# Patient Record
Sex: Female | Born: 1995 | Race: White | Hispanic: No | Marital: Single | Smoking: Heavy tobacco smoker
Health system: Southern US, Community
[De-identification: ages and names within clinical notes are randomized; demographics above are authoritative.]

---

## 2014-01-08 HISTORY — PX: CHOLECYSTECTOMY: SHX55

## 2014-08-12 ENCOUNTER — Emergency Department: Payer: Self-pay | Admitting: Emergency Medicine

## 2014-08-12 LAB — URINALYSIS, COMPLETE
BILIRUBIN, UR: NEGATIVE
Blood: NEGATIVE
Glucose,UR: NEGATIVE mg/dL (ref 0–75)
KETONE: NEGATIVE
Nitrite: POSITIVE
Ph: 5 (ref 4.5–8.0)
Protein: 30
RBC,UR: 4 /HPF (ref 0–5)
Renal Epithelial: 25
Specific Gravity: 1.028 (ref 1.003–1.030)

## 2015-01-01 ENCOUNTER — Inpatient Hospital Stay: Payer: Self-pay | Admitting: Surgery

## 2015-01-21 ENCOUNTER — Emergency Department: Payer: Self-pay | Admitting: Emergency Medicine

## 2015-03-11 LAB — SURGICAL PATHOLOGY

## 2015-03-17 NOTE — Consult Note (Signed)
Brief Consult Note: Diagnosis: acute pancreatitis.   Patient was seen by consultant.   Consult note dictated.   Comments: Appreciate consult for 19 y/o caucasian woman admitted with acute pancreatitis-likely biliary in nature, for evaluation of choledocholithiasis/pancreatitis. By report, was in good health until 2/14, at which time she developed some NV/ruq discomfort. RUQ pain and NV increased the next day, and she presented to the ED/was subsequently admitted. Was found to have elevated lipase, bilirubin, alp, ast, alt. Had a large stone in the gallbladder, and the gallbladder was contracted with concern for hydrops. There was an MRCP ordered, which demonstrated some cholelithiasis, but no acute cholecystitis/choledocholithiasis/further abnormalities. Has been treated with IVF, pain/nausea meds. Had surgical consult with Dr Juliann PulseLundquist who thinks she will need cholecystectomy. Lipase normal today. LFTs improving. States she is better overall, but is hungry/thirsty, and feels like this is making her nauseated. Denies abdominal pain/vomiting/further complaints. Never had illness like this before. Works at [employer], by [employer]'s report, her diet is unhealthy. LMP last week. Impression/Plan: Biliary pancreatitis with transamintis. Improving. Do agree with cholecystectomy, Would recommend intraoperative cholangiogram with procedure. Did discuss low fat/healthy diet with patient at her mother's request. Further work up as indicated clinically. Thank you for this consult.  Electronic Signatures: Vevelyn PatLondon, Aadit Hagood H (NP)  (Signed 17-Feb-16 16:33)  Authored: Brief Consult Note   Last Updated: 17-Feb-16 16:33 by Keturah BarreLondon, Starlynn Klinkner H (NP)

## 2015-03-17 NOTE — Consult Note (Signed)
PATIENT NAME:  Caitlin Guzman, [name] MR#:  [id number] DATE OF BIRTH:  04/22/1996  DATE OF CONSULTATION:  01/01/2015  CONSULTING PHYSICIAN:  Cristal Deerhristopher A. Lavren Lewan, MD  REASON FOR CONSULTATION: Nausea, vomiting, right upper quadrant pain, pancreatitis and hyperbilirubinemia in context of gallstones.   HISTORY OF PRESENT ILLNESS: [name] is a pleasant, relatively healthy 19 year old female, who presents with 3 days of nausea, vomiting, and 1 day of right upper quadrant pain. She says that initially she thought maybe she had some sort of bug. Also notes an episode of distention and what feels like gas pain. Last bowel movement was approximately 2 days ago, also developed right upper quadrant pain over the last day. Thought it was going to get better, came to the ED, was noted to have a lipase of 2500, as well as a bilirubin of 3.1. Ultrasound shows gallstones and 1 nonmobile stone. Otherwise, no fevers, chills, night sweats, shortness of breath, cough, chest pain, diarrhea, dysuria, or hematuria.   PAST MEDICAL HISTORY: History of multiple skin lesions and moles removed.   HOME MEDICATIONS: None.   ALLERGIES: None, no known drug allergies.   SOCIAL HISTORY: Smokes cigars, none recently. Intermittent social alcohol use. Has a  [employer] uniform that she is wearing.   REVIEW OF SYSTEMS: A 12-point review of systems obtained. Pertinent positives and negatives as above.   FAMILY HISTORY: Cancer, diabetes, heart failure.  Mother had gallstones and cholecystectomy at approximately age 21.   PHYSICAL EXAMINATION: VITAL SIGNS: Temperature 98.1, pulse 71, blood pressure 125/83, respirations 20, 100% on room air.  GENERAL: No acute distress. Alert and oriented x 3, talkative.  HEAD: Normocephalic, atraumatic.  EYES: No scleral icterus. No conjunctivitis. Face: No obvious facial trauma. Normal external nose. Normal external ears.  CHEST: Lungs clear to auscultation. Moving air well.  HEART: Regular  rate and rhythm. No murmurs, rubs, or gallops.  ABDOMEN: Soft, minimally tender, nondistended.  EXTREMITIES: Moves all extremities well. Strength 5/5.  NEUROLOGIC: Cranial nerves II-XII grossly intact.   LABORATORY DATA: Significant for a white cell count of 9.6, with 66% neutrophils. Lipase of 2500. Bilirubin of 3.1, alkaline phosphatase 218, AST of 243 and ALT of 737.   IMAGING: Ultrasound shows 19 mm nonmobile gallstone, a contracted gallbladder, normal size gallbladder wall at 3 mm, normal common bile duct at 3 mm.   ASSESSMENT AND PLAN: [name] is a pleasant 19 year old female who is relatively healthy. Denies any recent significant alcohol use. Presents with pancreatitis and hyperbilirubinemia. Concern for gallstone pancreatitis and choledocholithiasis and possible component of hydrops due to nonmobile stone. Would recommend making patient n.p.o. if  amylase continues to increase. Would recommend gastroenterology consult if bilirubin and amylase continue to increase. Will likely need prophylactic cholecystectomy prior to discharge, which I have recommended.     ____________________________ Si Raiderhristopher A. Jazzlynn Rawe, MD cal:LT D: 01/01/2015 19:58:57 ET T: 01/01/2015 20:27:03 ET JOB#: [id number]  cc: Cristal Deerhristopher A. Amalio Loe, MD, <Dictator> Jarvis NewcomerHRISTOPHER A Estephany Perot MD ELECTRONICALLY SIGNED 01/03/2015 22:32

## 2015-03-17 NOTE — Consult Note (Signed)
Chief Complaint:  Subjective/Chief Complaint Please see full GI consult and brief consult note.  Patietn admitted with pancreatitis, some elevation of lfts.  Interval improvement of lfts and lipase returning to normal.  Patietn stating she is hungry, wants to eat.  Revierw of MRCP indicates cholelithiasis but no choledocholithiasis, no evidence of CBD dilation.  Agree with [facility]Y.  No indication for ERCP at this point.   VITAL SIGNS/ANCILLARY NOTES: **Vital Signs.:   17-Feb-16 15:00  Vital Signs Type Q 8hr  Temperature Temperature (F) 98.2  Celsius 36.7  Temperature Source oral  Respirations Respirations 18  Systolic BP Systolic BP 811  Diastolic BP (mmHg) Diastolic BP (mmHg) 73  Mean BP 88  Pulse Ox % Pulse Ox % 99  Pulse Ox Activity Level  At rest  Oxygen Delivery Room Air/ 21 %   Brief Assessment:  Cardiac Regular   Gastrointestinal details normal Soft  Nondistended  bowel sounds positive,   Lab Results: Hepatic:  16-Feb-16 16:34   Bilirubin, Total  3.1  Alkaline Phosphatase  218  SGPT (ALT)  737  SGOT (AST)  243  Total Protein, Serum  8.4  Albumin, Serum 4.1  17-Feb-16 04:42   Bilirubin, Total  1.6  Alkaline Phosphatase  172  SGPT (ALT)  460  SGOT (AST)  117  Total Protein, Serum 6.5  Albumin, Serum  3.0  General Ref:  16-Feb-16 16:34   Hepatitis Panel A, B, C ========== TEST NAME ==========  ========= RESULTS =========  = REFERENCE RANGE =  HEPATITIS PANEL A, B, C  HP5+HAVIgM+HBcIgM Hep A Ab, IgM                   [   Negative             ]          Negative Hep A Ab, Total                 [   Positive             ]          Negative HBsAg Screen                    [   Negative             ]          Negative Hep B Core Ab, IgM              [   Negative             ]          Negative Hep B Core Ab, Tot              [   Negative             ]         Negative Hep B Surface Ab, Qual          [   Reactive             ]                      Non Reactive: Inconsistent with immunity,  less than 10 mIU/mL            Reactive:     Consistent with immunity,                                            greater than 9.9 mIU/mL **Verified by repeat analysis** HCV Ab                          [   <0.1 s/co ratio      ]           0.0-0.9 Comment:            [   Final Report         ]                   Non reactive HCV antibody screen is consistent with no HCV infection, unless recent infection is suspected or other evidence exists to indicate HCV infection.               [facility]            No: [id number]           [address], [place], [place] [address]           Lindon Romp, MD         [phone]   Result(s) reported on 02 Jan 2015 at 08:21AM.  Routine Chem:  16-Feb-16 16:34   Lipase  2534 (Result(s) reported on 01 Jan 2015 at 05:07PM.)  Glucose, Serum 81  BUN 12  Creatinine (comp) 0.61  Sodium, Serum 139  Potassium, Serum 3.8  Chloride, Serum 104  CO2, Serum 26  Calcium (Total), Serum 9.3  Osmolality (calc) 276  eGFR (African American) >60  eGFR (Non-African American) >60 (eGFR values <5m/min/1.73 m2 may be an indication of chronic kidney disease (CKD). Calculated eGFR, using the MRDR Study equation, is useful in  patients with stable renal function. The eGFR calculation will not be reliable in acutely ill patients when serum creatinine is changing rapidly. It is not useful in patients on dialysis. The eGFR calculation may not be applicable to patients at the low and high extremes of body sizes, pregnant women, and vegetarians.)  Anion Gap 9  17-Feb-16 04:42   Lipase 200 (Result(s) reported on 02 Jan 2015 at 05:33AM.)  Glucose, Serum 86  BUN 8  Creatinine (comp) 0.68  Sodium, Serum 140  Potassium, Serum 3.9  Chloride, Serum  108  CO2, Serum 23  Calcium (Total), Serum  8.3  Osmolality (calc) 277  eGFR (African American) >60  eGFR (Non-African  American) >60 (eGFR values <639mmin/1.73 m2 may be an indication of chronic kidney disease (CKD). Calculated eGFR, using the MRDR Study equation, is useful in  patients with stable renal function. The eGFR calculation will not be reliable in acutely ill patients when serum creatinine is changing rapidly. It is not useful in patients on dialysis. The eGFR calculation may not be applicable to patients at the low and high extremes of body sizes, pregnant women, and vegetarians.)  Anion Gap 9  Routine UA:  16-Feb-16 14:55   Color (UA) Amber  Clarity (UA) Hazy  Glucose (UA) Negative  Bilirubin (UA) 1+  Ketones (UA) Negative  Specific Gravity (UA) 1.028  Blood (UA) Negative  pH (UA) 5.0  Protein (UA) 30 mg/dL  Nitrite (UA) Negative  Leukocyte Esterase (UA) 1+ (Result(s) reported on 01 Jan 2015 at 03:34PM.)  RBC (UA) 2 /HPF  WBC (UA) 12 /HPF  Bacteria (UA) NONE SEEN  Epithelial Cells (UA) 9 /HPF  Mucous (UA) PRESENT  Amorphous Crystal (UA) PRESENT (Result(s) reported on 01 Jan 2015 at 03:34PM.)  Routine Hem:  16-Feb-16 16:34   WBC (CBC) 9.6  RBC (CBC) 4.73  Hemoglobin (CBC) 13.6  Hematocrit (CBC) 40.9  Platelet Count (CBC) 281  MCV 86  MCH 28.8  MCHC 33.3  RDW 13.2  Neutrophil % 66.2  Lymphocyte % 22.0  Monocyte % 8.6  Eosinophil % 2.9  Basophil % 0.3  Neutrophil # 6.3  Lymphocyte # 2.1  Monocyte # 0.8  Eosinophil # 0.3  Basophil # 0.0 (Result(s) reported on 01 Jan 2015 at 04:57PM.)   Assessment/Plan:  Assessment/Plan:  Assessment as noted   Electronic Signatures: Loistine Simas (MD)  (Signed 17-Feb-16 17:35)  Authored: Chief Complaint, VITAL SIGNS/ANCILLARY NOTES, Brief Assessment, Lab Results, Assessment/Plan   Last Updated: 17-Feb-16 17:35 by Loistine Simas (MD)

## 2015-03-17 NOTE — Consult Note (Signed)
PATIENT NAME:  Caitlin Guzman, Caitlin Guzman MR#:  [id number] DATE OF BIRTH:  09/10/1996  DATE OF CONSULTATION:  01/02/2015  REFERRING PHYSICIAN:  Ida Roguehristopher Lundquist, MD CONSULTING PHYSICIAN:  Keturah Barrehristiane H. Jamason Peckham, NP  REASON FOR CONSULTATION: Evaluation of choledocholithiasis, pancreatitis.  HISTORY OF PRESENT ILLNESS: Appreciate consult for 19 year old Caucasian woman admitted with acute pancreatitis, likely biliary in nature, for evaluation of choledocholithiasis and pancreatitis. By report was in good health until February 14th at which time she developed some nausea and vomiting, right upper quadrant discomfort. Right upper quadrant pain and nausea and vomiting increased the next day and presented to the ED. Was subsequently admitted. Was found to have elevated lipase, bilirubin, ALP, AST and ALT. Had a large stone in the gallbladder. The gallbladder was contracted with concern for hydrops. There was a MRCP ordered which demonstrated some cholelithiasis, but no acute cholecystitis/choledocholithiasis or further abnormalities. She did have an acute hepatitis panel revealing for antibodies to hepatitis A, but no acute infection. She had negative hepatitis Guzman and hepatitis C testing Has been treated with IV fluids, pain and nausea medications. Had surgical consult with Dr. Juliann PulseLundquist who thinks she will need cholecystectomy. Lipase normal today. LFTs improving. States she is better overall but hungry and thirsty and feels like this is making her nauseated. Denies abdominal pain, vomiting, further complaints. States she has never had an illness like this before. Works at [employer]. By mother's report, her diet is unhealthy. LMP last week.   PAST MEDICAL HISTORY: Significant for mole removal, otherwise unremarkable.   HOME MEDICATIONS: None.   ALLERGIES: NKDA.   FAMILY HISTORY: Mother had cholecystectomy. Dad had nonalcoholic fatty liver cirrhosis. Otherwise, no family history of colorectal cancer, colon  polyps.   REVIEW OF SYSTEMS: Ten systems reviewed, unremarkable other than what is noted above.   DIAGNOSTIC DATA: Most recent labs: Glucose 86, BUN 8, creatinine 0.68, sodium 140, potassium 3.9. GFR greater than 60. Lipase 200, total protein 6.5, albumin 3, total bilirubin 1.6, ALP 172, AST 117, ALT 460. Complete blood count drawn was normal.   Imaging studies as above.   PHYSICAL EXAMINATION: VITAL SIGNS: Most recent: Temp 98.2, pulse 62, respiratory rate 18, blood pressure 118/73, SaO2 99% on room air.  GENERAL: Well-appearing young woman, resting in bed, in no acute distress.  HEENT: Normocephalic, atraumatic. Sclerae are clear. Conjunctivae pink.  NECK: Supple. No thyromegaly, lymphadenopathy or JVD.  CARDIAC: S1, S2. RRR. No MRG. Peripheral pulses palpable. No appreciable edema.  CHEST: Respirations eupneic. Lungs clear.  ABDOMEN: Flat. Bowel sounds x4. Nontender, nondistended. No guarding, rigidity, peritoneal signs, hepatosplenomegaly, masses or other abnormalities.  SKIN: Warm, dry, pink. No erythema, lesion or rash.  EXTREMITIES: No clubbing or cyanosis. MAEW x4. Strength 5/5.  NEUROLOGIC: Alert, oriented x3. Cranial nerves II through XII intact.  PSYCHIATRIC: Pleasant, calm, cooperative.   IMPRESSION AND PLAN: Biliary pancreatitis with transaminitis, improving. Do agree with cholecystectomy. Would recommend intraoperative cholangiogram with procedure. I did discuss low fat/healthy diet with the patient at her mother's request. Further work-up as indicated clinically.   Thank you very much for this consult.   This services were provided by Vevelyn Pathristiane Yaire Kreher, MSN, NPC in collaboration with Christena DeemMartin U. Skulskie MD with whom I have discussed this patient in full.   ____________________________ Keturah Barrehristiane H. Lincoln Kleiner, NP chl:sb D: 01/03/2015 08:57:00 ET T: 01/03/2015 09:15:01 ET JOB#: [id number]  cc: Keturah Barrehristiane H. Christopherjohn Schiele, NP, <Dictator> Eustaquio MaizeHRISTIANE H Toney Difatta FNP ELECTRONICALLY SIGNED  01/03/2015 17:06

## 2015-03-17 NOTE — Op Note (Signed)
PATIENT NAME:  Caitlin Guzman, Caitlin Guzman MR#:  [id number] DATE OF BIRTH:  19-Oct-1996  DATE OF PROCEDURE:  01/03/2015  PREOPERATIVE DIAGNOSIS: Chronic cholecystitis, cholelithiasis, possible biliary pancreatitis.   POSTOPERATIVE DIAGNOSIS: Chronic cholecystitis, cholelithiasis, possible biliary pancreatitis.   PROCEDURE: Laparoscopic cholecystectomy.   SURGEON: Quentin Orealph L. Ely, MD    ANESTHESIA: General.   OPERATIVE PROCEDURE: With the patient in the supine position after the induction of appropriate general anesthesia, the patient's abdomen was prepped with ChloraPrep and draped with sterile towels. The patient was placed in the head down, feet up position. A small infraumbilical incision was made in the standard fashion, carried down bluntly through the subcutaneous tissue. A Veress needle was used to cannulate the peritoneal cavity. CO2 was insufflated to appropriate pressure measurements. When approximately 2.5 liters of CO2 were instilled, the Veress needle was withdrawn. An 11 mm applied medical port inserted into the peritoneal cavity. Intraperitoneal position was confirmed. CO2 was reinsufflated.  The patient was placed in the head up feet down position, rotated slightly to the left side. A subxiphoid transverse incision was made and an 11 mm port was inserted under direct vision. Two lateral ports, 5 mm in size, were inserted under direct vision. The gallbladder appeared to be significantly intrahepatic. It was discolored and mildly edematous. It was grasped, retracted superiorly and laterally. The cystic duct was identified and was quite short. Common duct was visualized. The cystic duct was mildly dilated. The duct was so short I did not believe I would have an occasion to be able to do a cholangiogram and be able to safely clamp the duct, so I did not pursue cholangiography. The duct was clipped on both sides and divided. The cystic artery was doubly clipped and divided. The right hepatic was seen  curving behind the intrahepatic gallbladder and was preserved. The gallbladder was then dissected free from its bed and delivered using hook and cautery apparatus. Once the gallbladder was free, the camera remained in the umbilical port, and the gallbladder was brought though the epigastric port. The incision had to be slightly enlarged to accommodate the significant number of stones. The area was copiously suction irrigated. The upper midline incision was closed with a figure-of-eight suture of 0 Vicryl using the suture passer. The abdomen was desufflated. All ports were withdrawn without difficulty. Skin incisions were closed with 5-0 nylon. The area was infiltrated with 0.25% Marcaine for postoperative pain control. Sterile dressings were applied. The patient was returned to the recovery room, having tolerated the procedure well. Sponge, instrument and needle counts were correct x 2 in the Operating Room.    ____________________________ Carmie Endalph L. Ely III, MD rle:at D: 01/03/2015 17:13:37 ET T: 01/03/2015 18:36:30 ET JOB#: [id number]  cc: Carmie Endalph L. Ely III, MD, <Dictator> Quentin OreALPH L ELY MD ELECTRONICALLY SIGNED 01/04/2015 18:10

## 2015-03-17 NOTE — Discharge Summary (Signed)
PATIENT NAME:  Caitlin Guzman, Caitlin Guzman MR#:  [id number] DATE OF BIRTH:  1996-09-16  DATE OF ADMISSION:  01/01/2015 DATE OF DISCHARGE:  01/09/2015   BRIEF HISTORY: Janit BernHaley Tunstall is a 19 year old woman admitted through the Emergency Room with signs and symptoms consistent with biliary pancreatitis. She had a couple of day's history of abdominal pain and mild nausea and started vomiting.  Ultrasound demonstrated the large nonmobile gallstones with elevated liver function. Both her bilirubin and lipase were normal. Lipase was 2500, bilirubin was 3.12.  She was admitted to the hospital for further intervention. Gastroenterology saw the patient. They did not feel ERCP was necessary. M.R.C.P. was performed on February 17, which did not demonstrate any evidence of common bile duct dilatation or choledocholithiasis. She had evidence of cholelithiasis. She was taken to surgery on February 18 where she underwent a laparoscopic appendectomy. The procedure was complicated by the fact that she had a significant intrahepatic gallbladder. Resection was quite difficult. She did well over the next 24 hours and developed sudden onset of vomiting on February 19.  In the evening she became markedly tachycardiac with heart rate into the 130s and 140s.  Hemoglobin on the morning of February 19 was 10.6 and on the morning of February 20 was 6.9. It was apparent that she had bled something in the bed of the liver or hepatoduodenal ligament. She was given 2 units of blood and her hemoglobin stabilized at 9.1. She continued to improve over the next couple of days. Her nausea resolved. Hemoglobin remained stable.  Yesterday morning on February 23, her hemoglobin was 10.6.  Heart rate has come down to the 90s.  She has no other significant symptoms.  Her pain is well controlled.  She will be discharged home today. She will follow up in the office in 7-10 days' time. Bathing, activity, and driving instructions were given to the patient.    DISCHARGE MEDICATIONS: Include Percocet 5/325 every 4-6 hours p.r.n. and Zofran 4 mg q. 4-6 hours p.r.n.   FINAL DISCHARGE DIAGNOSIS: Biliary pancreatitis, cholelithiasis, postoperative hemorrhage.   SURGERY: Laparoscopic cholecystectomy.    ____________________________ Carmie Endalph L. Ely III, MD rle:mc D: 01/09/2015 07:56:09 ET T: 01/09/2015 09:39:47 ET JOB#: [id number]  cc: Carmie Endalph L. Ely III, MD, <Dictator> Kristine GarbeWilliam C. Gladis Riffleuffian, III, PA-C Christena DeemMartin U. Skulskie, MD  Quentin OreALPH L ELY MD ELECTRONICALLY SIGNED 01/11/2015 17:25

## 2015-03-17 NOTE — H&P (Signed)
PATIENT NAME:  Caitlin Guzman, Caitlin Guzman MR#:  [id number] DATE OF BIRTH:  14-Aug-1996  DATE OF ADMISSION:  01/01/2015  PRIMARY CARE PROVIDER: Kristine GarbeWilliam C. Ruffian, III, PA-C  CHIEF COMPLAINT: Vomiting, right upper quadrant abdominal pain.   HISTORY OF PRESENTING ILLNESS: A 19 year old female patient with no significant past medical history, who presents to the hospital complaining of vomiting which started 2 days prior. The patient threw up about 4-5 times, nonbilious, no blood. Also today, she had acute pain in her right upper quadrant area which made her come to the Emergency Room. Here, she has received a dose of Toradol, presently pain is improved. Ultrasound has shown a large nonmobile gallstone, but also blood work has shown elevated LFTs, bilirubin along with elevated lipase with pancreatitis and is being admitted to the hospital.   PAST MEDICAL HISTORY: None.   HOME MEDICATIONS: None.   ALLERGIES:  No drug allergies.  FAMILY HISTORY: Diabetes in both sides of the family along with kidney problems.   REVIEW OF SYSTEMS: CONSTITUTIONAL: No fever, fatigue.  EYES: No blurred vision, pain, or redness.  EARS, NOSE, AND THROAT: No tinnitus, ear pain, hearing loss.  RESPIRATORY: No cough, wheeze, hemoptysis.  CARDIOVASCULAR: No chest pain, orthopnea, or edema.  GASTROINTESTINAL: Has nausea, abdominal pain.  GENITOURINARY: No dysuria, hematuria, frequency.  ENDOCRINE: No polyuria, nocturia, thyroid problems.  HEMATOLOGIC AND LYMPHATIC: No anemia, easy bruising or bleeding.  INTEGUMENTARY: No acne, rash, lesion.  MUSCULOSKELETAL: No back pain or arthritis.  NEUROLOGIC: No focal numbness, weakness, seizure.  PSYCHIATRY:  No anxiety or depression.   SOCIAL HISTORY: The patient smokes rare cigar. Occasional alcohol use. No illicit drug use. Works at [employer]. No history of hepatitis A, Guzman, C.  CODE STATUS: Full code.   PHYSICAL EXAMINATION: VITAL SIGNS: Temperature 98.2, pulse of 80, blood  pressure 122/75, saturating 93% on room air.  GENERAL: Moderately built Caucasian female patient lying in bed, seems comfortable.  PSYCHIATRIC: Alert and oriented x 3, pleasant.  HEENT: Atraumatic, normocephalic. Oral mucosa dry and pink. Extraocular movements normal. No pallor. No icterus. Pupils are bilaterally equal and reactive to light.  NECK: Supple. No thyromegaly. No palpable lymph nodes. Trachea midline. No carotid bruit, JVD.  CARDIOVASCULAR: S1, S2, without any murmurs heard. Peripheral pulses 2+. No edema.  RESPIRATORY: Normal work of breathing. Clear to auscultation on both sides.  GASTROINTESTINAL: Soft abdomen. Tenderness in the right upper quadrant area. Murphy sign negative. No hepatosplenomegaly palpable. Bowel sounds normal. No rigidity or guarding.  GENITOURINARY: No CVA tenderness or bladder distention. SKIN: Warm and dry. No petechiae, rash, ulcers.  MUSCULOSKELETAL: No joint swelling, redness, effusion of the large joints. Normal muscle tone.  NEUROLOGICAL: Motor strength 5/5 in upper extremities. Sensory function intact all over.  LYMPHATIC: No cervical lymphadenopathy.   LABORATORY STUDIES: Show glucose of 81, BUN 12, creatinine 0.61, sodium 139, potassium 3.8. GFR greater than 60.  Urine pregnancy test negative. Lipase of 2534. AST, ALT, elevated at 243 and 737, with alkaline phosphatase of 218, and bilirubin 3.1, albumin 4.1. WBC is 9.6, hemoglobin 13.6, platelets of 281,000.  Urinalysis shows 1+ leukocyte esterase with no bacteria, 12 WBC.   IMAGING:  Ultrasound of the right upper quadrant area shows a 19 mm nonmobile gallstone. Gallstone is contracted. Gallbladder wall is normal. CBD diameter is only 3 mm.   ASSESSMENT AND PLAN: 1.  Gallstone pancreatitis with elevated bilirubin, AST, ALT, alkaline phosphatase, likely the patient has passed a stone over the last 2 days with elevated bilirubin  and liver numbers. Presently, common bile duct diameter is normal. Will start  her on clears, pain control along with nausea medication. Once her lipase trends down, she will need cholecystectomy. Will consult surgery. Will need to repeat her laboratories tomorrow morning and depending on this laboratory work, may have to get an MRCP, but presently this does not seem necessary as the common bile duct is normal.  2.  Jaundice, likely obstructive, which is mild, should improve. We will repeat in the morning.  3.  Deep vein thrombosis prophylaxis with Lovenox.  4.  Code status is full code.   TIME SPENT TODAY ON THIS CASE: 45 minutes.   ____________________________ Molinda Bailiff Caitlin Vessel, MD srs:LT D: 01/01/2015 19:28:51 ET T: 01/01/2015 20:09:30 ET JOB#: [id number]  cc: Wardell Heath R. Elpidio Anis, MD, <Dictator> Orie Fisherman MD ELECTRONICALLY SIGNED 01/02/2015 16:46

## 2017-01-08 ENCOUNTER — Encounter: Payer: Self-pay | Admitting: Emergency Medicine

## 2017-01-08 ENCOUNTER — Emergency Department: Payer: Self-pay

## 2017-01-08 ENCOUNTER — Emergency Department
Admission: EM | Admit: 2017-01-08 | Discharge: 2017-01-08 | Disposition: A | Discharge status: Home / Self Care | Payer: Self-pay | Attending: Emergency Medicine | Admitting: Emergency Medicine

## 2017-01-08 DIAGNOSIS — F17200 Nicotine dependence, unspecified, uncomplicated: Secondary | ICD-10-CM | POA: Insufficient documentation

## 2017-01-08 DIAGNOSIS — K5900 Constipation, unspecified: Secondary | ICD-10-CM | POA: Insufficient documentation

## 2017-01-08 LAB — CBC
HEMATOCRIT: 40.6 % (ref 35.0–47.0)
HEMOGLOBIN: 14 g/dL (ref 12.0–16.0)
MCH: 30.5 pg (ref 26.0–34.0)
MCHC: 34.5 g/dL (ref 32.0–36.0)
MCV: 88.4 fL (ref 80.0–100.0)
Platelets: 291 10*3/uL (ref 150–440)
RBC: 4.6 MIL/uL (ref 3.80–5.20)
RDW: 12.8 % (ref 11.5–14.5)
WBC: 11.7 10*3/uL — AB (ref 3.6–11.0)

## 2017-01-08 LAB — COMPREHENSIVE METABOLIC PANEL
ALK PHOS: 83 U/L (ref 38–126)
ALT: 31 U/L (ref 14–54)
AST: 26 U/L (ref 15–41)
Albumin: 4.5 g/dL (ref 3.5–5.0)
Anion gap: 6 (ref 5–15)
BILIRUBIN TOTAL: 0.4 mg/dL (ref 0.3–1.2)
BUN: 11 mg/dL (ref 6–20)
CALCIUM: 8.9 mg/dL (ref 8.9–10.3)
CO2: 27 mmol/L (ref 22–32)
CREATININE: 0.57 mg/dL (ref 0.44–1.00)
Chloride: 103 mmol/L (ref 101–111)
GFR calc Af Amer: >60 mL/min (ref >60)
GFR calc non Af Amer: >60 mL/min (ref >60)
GLUCOSE: 86 mg/dL (ref 65–99)
Potassium: 3.7 mmol/L (ref 3.5–5.1)
Sodium: 136 mmol/L (ref 135–145)
TOTAL PROTEIN: 8.1 g/dL (ref 6.5–8.1)

## 2017-01-08 LAB — URINALYSIS, COMPLETE (UACMP) WITH MICROSCOPIC
BACTERIA UA: NONE SEEN
Bilirubin Urine: NEGATIVE
Glucose, UA: NEGATIVE mg/dL
HGB URINE DIPSTICK: NEGATIVE
Ketones, ur: NEGATIVE mg/dL
Nitrite: NEGATIVE
Protein, ur: NEGATIVE mg/dL
Specific Gravity, Urine: 1.018 (ref 1.005–1.030)
pH: 7 (ref 5.0–8.0)

## 2017-01-08 LAB — POCT PREGNANCY, URINE: PREG TEST UR: NEGATIVE

## 2017-01-08 LAB — LIPASE, BLOOD: Lipase: 37 U/L (ref 11–51)

## 2017-01-08 MED ORDER — IOPAMIDOL (ISOVUE-300) INJECTION 61%
100.0000 mL | Freq: Once | INTRAVENOUS | Status: AC | PRN
  Administered 2017-01-08: 100 mL via INTRAVENOUS
  Filled 2017-01-08: qty 100

## 2017-01-08 MED ORDER — POLYETHYLENE GLYCOL 3350 17 G PO PACK: 17.0000 g | PACK | Freq: Every day | ORAL | 0 refills | Status: AC

## 2017-01-08 MED ORDER — IOPAMIDOL (ISOVUE-300) INJECTION 61%
30.0000 mL | Freq: Once | INTRAVENOUS | Status: AC
  Administered 2017-01-08: 30 mL via ORAL
  Filled 2017-01-08: qty 30

## 2017-01-08 NOTE — ED Provider Notes (Signed)
[facility] Emergency Department Provider Note   ____________________________________________    I have reviewed the triage vital signs and the nursing notes.   HISTORY  Chief Complaint Abdominal pain    HPI Caitlin Guzman is a 21 y.o. female who presents with complaints of right lower quadrant abdominal pain. Patient reports pain is constant and has been there for approximately 4 days. She has never had this before. She denies dysuria. No fevers or chills. She treated the pain to constipation. She is a bowel movement approximately one week. Typically she has 2 bowel movements per week. She has had a cholecystectomy in the past.   History reviewed. No pertinent past medical history.  There are no active problems to display for this patient.   Past Surgical History:  Procedure Laterality Date  . CHOLECYSTECTOMY  01/08/2014    Prior to Admission medications   Medication Sig Start Date End Date Taking? Authorizing Provider  polyethylene glycol (MIRALAX) packet Take 17 g by mouth daily. 01/08/17   Jene Everyobert Makayela Secrest, MD     Allergies Morphine and related  No family history on file.  Social History Social History  Substance Use Topics  . Smoking status: Heavy Tobacco Smoker    Packs/day: 0.50  . Smokeless tobacco: Never Used  . Alcohol use 1.2 oz/week    2 Cans of beer per week    Review of Systems  Constitutional: No fever/chills Eyes: No visual changes.   Cardiovascular: Denies chest pain. Respiratory: Denies shortness of breath. Gastrointestinal: As above Genitourinary: Negative for dysuria. Musculoskeletal: Negative for back pain. Skin: Negative for rash. Neurological: Negative for headaches  10-point ROS otherwise negative.  ____________________________________________   PHYSICAL EXAM:  VITAL SIGNS: ED Triage Vitals  Enc Vitals Group     BP 01/08/17 1853 126/74     Pulse Rate 01/08/17 1853 77     Resp 01/08/17  1853 16     Temp 01/08/17 1853 98.7 F (37.1 C)     Temp Source 01/08/17 1853 Oral     SpO2 01/08/17 1909 100 %     Weight 01/08/17 1853 160 lb (72.6 kg)     Height 01/08/17 1853 5\' 4"  (1.626 m)     Head Circumference --      Peak Flow --      Pain Score 01/08/17 1853 4     Pain Loc --      Pain Edu? --      Excl. in GC? --     Constitutional: Alert and oriented. No acute distress. Pleasant and interactive Eyes: Conjunctivae are normal.   Nose: No congestion/rhinnorhea. Mouth/Throat: Mucous membranes are moist.    Cardiovascular: Normal rate, regular rhythm. Grossly normal heart sounds.  Good peripheral circulation. Respiratory: Normal respiratory effort.  No retractions. Lungs CTAB. Gastrointestinal: Mild tenderness to palpation in the right lower quadrant. No distention.  No CVA tenderness. Genitourinary: deferred Musculoskeletal: No lower extremity tenderness nor edema.  Warm and well perfused Neurologic:  Normal speech and language. No gross focal neurologic deficits are appreciated.  Skin:  Skin is warm, dry and intact. No rash noted. Psychiatric: Mood and affect are normal. Speech and behavior are normal.  ____________________________________________   LABS (all labs ordered are listed, but only abnormal results are displayed)  Labs Reviewed  CBC - Abnormal; Notable for the following:       Result Value   WBC 11.7 (*)    All other components within normal limits  URINALYSIS,  COMPLETE (UACMP) WITH MICROSCOPIC - Abnormal; Notable for the following:    Color, Urine YELLOW (*)    APPearance CLOUDY (*)    Leukocytes, UA MODERATE (*)    Squamous Epithelial / LPF 6-30 (*)    All other components within normal limits  COMPREHENSIVE METABOLIC PANEL  LIPASE, BLOOD  POCT PREGNANCY, URINE   ____________________________________________  EKG  None ____________________________________________  RADIOLOGY  CT abdomen and pelvis  negative ____________________________________________   PROCEDURES  Procedure(s) performed: No    Critical Care performed: No ____________________________________________   INITIAL IMPRESSION / ASSESSMENT AND PLAN / ED COURSE  Pertinent labs & imaging results that were available during my care of the patient were reviewed by me and considered in my medical decision making (see chart for details).  Patient presents with right lower quadrant abdominal pain. She is mildly tender in that area. We will check labs and consider imaging  Opted to get CT abdomen and pelvis given mild elevation in white blood cell count and given her tenderness in the right lower quadrant. CT is unremarkable. She does have constipation which is likely the cause of her discomfort. Recommended MiraLAX and increased fiber diet. Return precautions discussed   ____________________________________________   FINAL CLINICAL IMPRESSION(S) / ED DIAGNOSES  Final diagnoses:  Constipation, unspecified constipation type      NEW MEDICATIONS STARTED DURING THIS VISIT:  Discharge Medication List as of 01/08/2017  9:14 PM    START taking these medications   Details  polyethylene glycol (MIRALAX) packet Take 17 g by mouth daily., Starting Fri 01/08/2017, Print         Note:  This document was prepared using Dragon voice recognition software and may include unintentional dictation errors.    Jene Every, MD 01/08/17 2158

## 2017-01-08 NOTE — ED Triage Notes (Signed)
Pt states she has bene on a "bad" diet consisting of dominos and pasta and this has caused her to be stopped up, and she reports her last BM last week.  She is reporting a pain on her RLQ.  She also says her urine is a bright green color, and denies taking any vitamin supplements.  She says she voids usually about 3x a day.

## 2017-11-29 IMAGING — CT CT ABD-PELV W/ CM
2 of 4 series · 16 of 46 positions shown, 18 images · IV contrast (iopamidol)
Comparison: None.

CLINICAL DATA: Right lower quadrant pain 3 days. Last bowel
movement 1 week ago.

EXAM:
CT ABDOMEN AND PELVIS WITH CONTRAST
TECHNIQUE: Multidetector CT imaging of the abdomen and pelvis was performed
using the standard protocol following bolus administration of
intravenous contrast.
CONTRAST:  100mL [id number] IOPAMIDOL ([id number]) INJECTION 61%

[Series 2: routine abd/pel with · axial · 0.74mm/px · z∈[-1151,-731]mm · 13 of 94 slices shown, 15 images]
[im 5/94  soft-tissue]
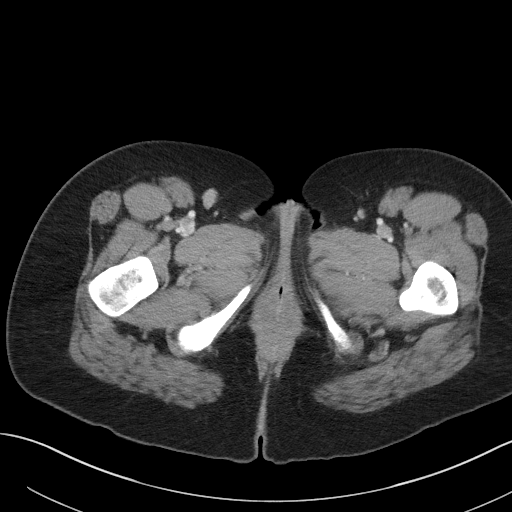
[im 5/94  bone]
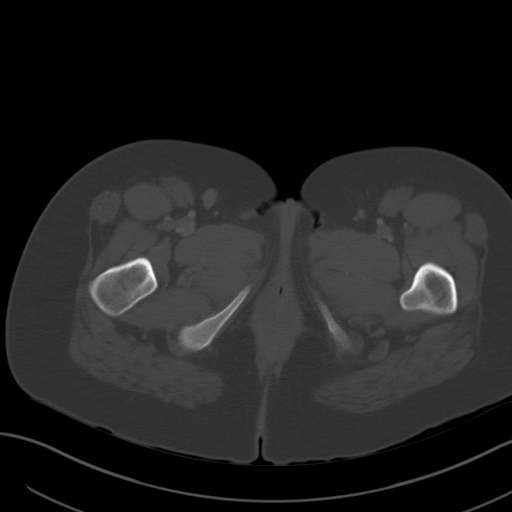
[im 13/94  soft-tissue]
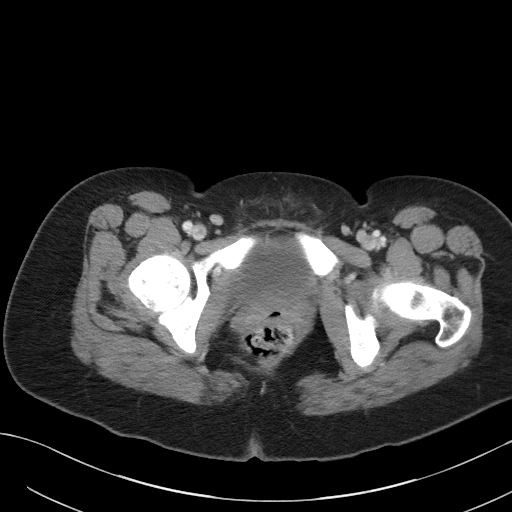
[im 21/94  soft-tissue]
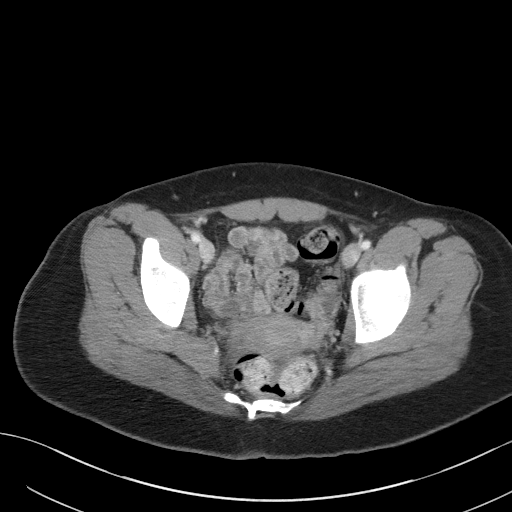
[im 25/94  soft-tissue]
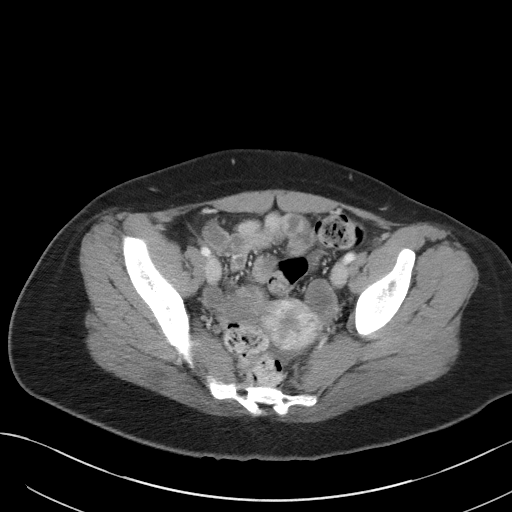
[im 33/94  soft-tissue]
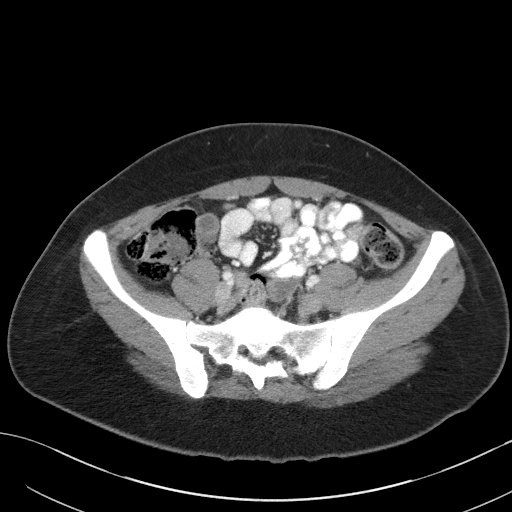
[im 41/94  soft-tissue]
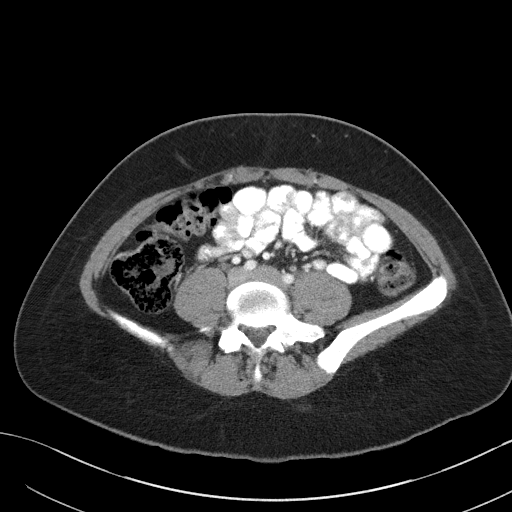
[im 49/94  soft-tissue]
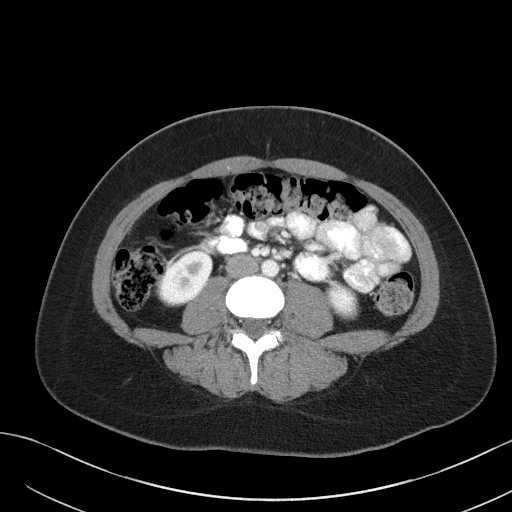
[im 53/94  soft-tissue]
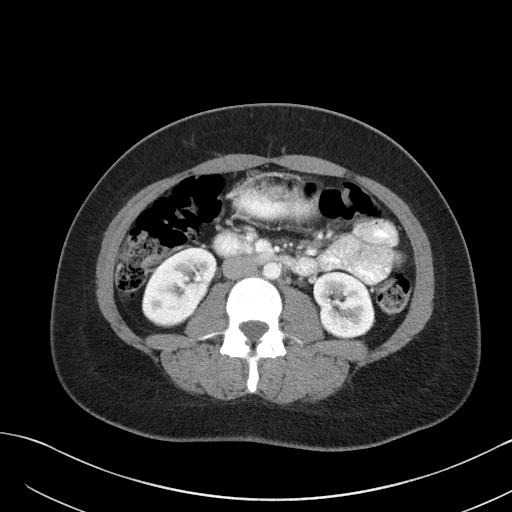
[im 61/94  soft-tissue]
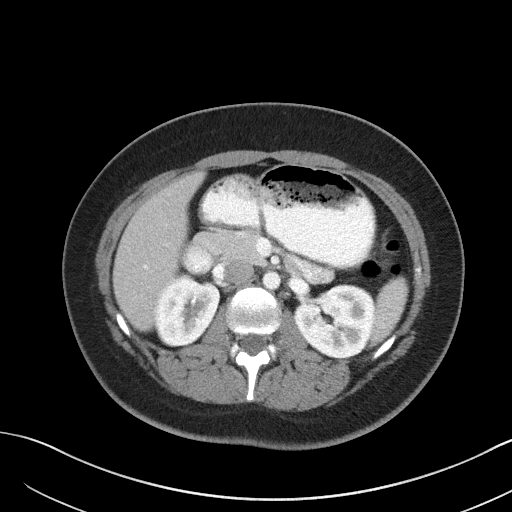
[im 61/94  bone]
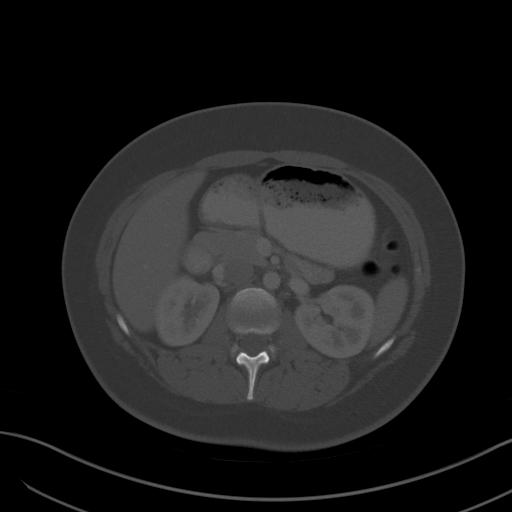
[im 69/94  soft-tissue]
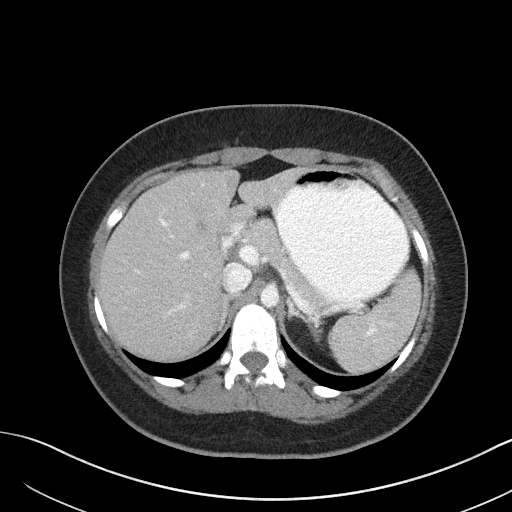
[im 73/94  soft-tissue]
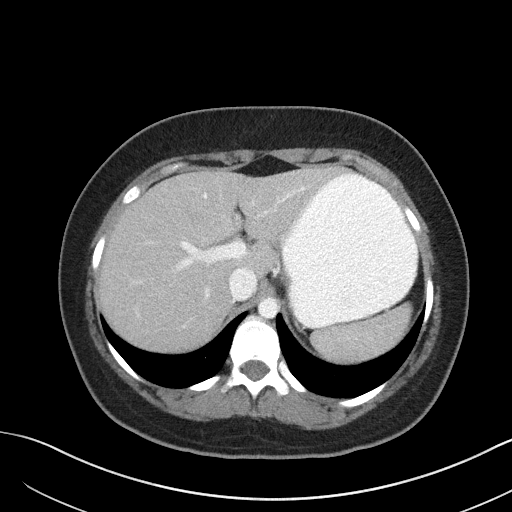
[im 81/94  soft-tissue]
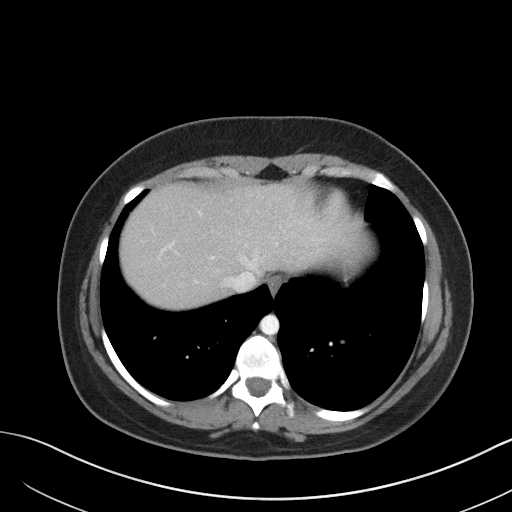
[im 89/94  soft-tissue]
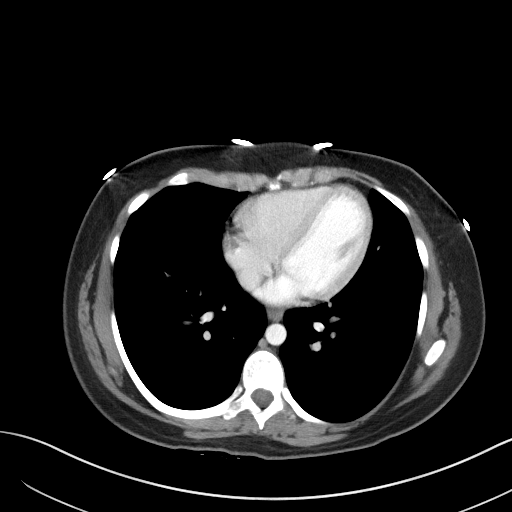

[Series 5: coronal st · coronal · 0.68mm/px · 3 of 86 slices shown]
[im 29/86  soft-tissue]
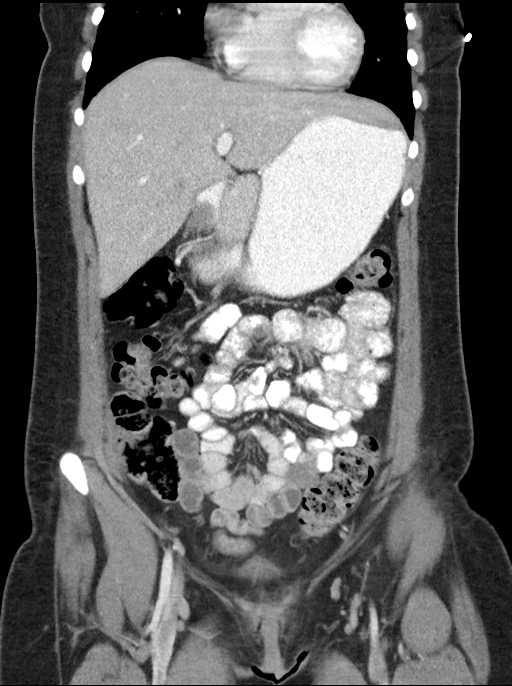
[im 38/86  soft-tissue]
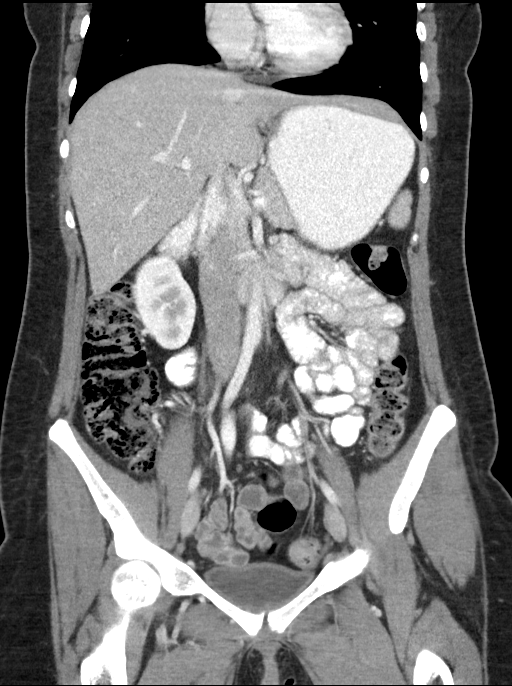
[im 48/86  soft-tissue]
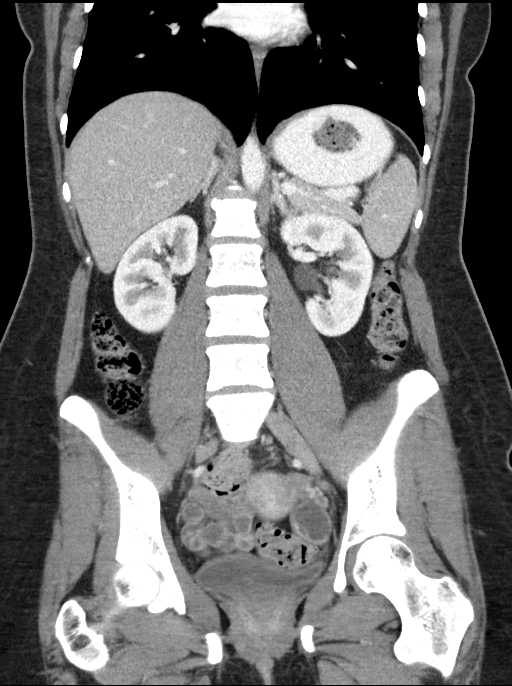

[16 of 46 positions shown; findings below may reference images not displayed]

FINDINGS: Lower chest: Lung bases are normal.

Hepatobiliary: The liver and biliary tree are normal. Previous
cholecystectomy.

Pancreas: Within normal.

Spleen: Within normal.

Adrenals/Urinary Tract: Adrenal glands are normal. Kidneys normal
size without hydronephrosis or nephrolithiasis. Ureters and bladder
are normal.

Stomach/Bowel: Contrast filled mildly distended stomach. Small bowel
is within normal. Appendix is normal. Mild fecal retention
throughout the colon which is otherwise unremarkable.

Vascular/Lymphatic: Vascular structures are within normal. No
evidence of adenopathy.

Reproductive: Within normal. Tiny amount of free fluid in the pelvis
likely physiologic.

Other: No focal inflammatory change.

Musculoskeletal: Within normal.
IMPRESSION: No acute findings in the abdomen/pelvis.

## 2020-02-22 ENCOUNTER — Other Ambulatory Visit: Payer: Self-pay

## 2020-02-22 DIAGNOSIS — R531 Weakness: Secondary | ICD-10-CM | POA: Diagnosis present

## 2020-02-22 DIAGNOSIS — F17210 Nicotine dependence, cigarettes, uncomplicated: Secondary | ICD-10-CM | POA: Diagnosis not present

## 2020-02-22 DIAGNOSIS — Y9289 Other specified places as the place of occurrence of the external cause: Secondary | ICD-10-CM | POA: Diagnosis not present

## 2020-02-22 DIAGNOSIS — Y998 Other external cause status: Secondary | ICD-10-CM | POA: Diagnosis not present

## 2020-02-22 DIAGNOSIS — M7918 Myalgia, other site: Secondary | ICD-10-CM | POA: Insufficient documentation

## 2020-02-22 DIAGNOSIS — W19XXXA Unspecified fall, initial encounter: Secondary | ICD-10-CM | POA: Diagnosis not present

## 2020-02-22 DIAGNOSIS — Y9301 Activity, walking, marching and hiking: Secondary | ICD-10-CM | POA: Diagnosis not present

## 2020-02-22 DIAGNOSIS — R55 Syncope and collapse: Secondary | ICD-10-CM | POA: Diagnosis not present

## 2020-02-22 LAB — URINALYSIS, COMPLETE (UACMP) WITH MICROSCOPIC
Bilirubin Urine: NEGATIVE
Glucose, UA: NEGATIVE mg/dL
Hgb urine dipstick: NEGATIVE
Ketones, ur: NEGATIVE mg/dL
Leukocytes,Ua: NEGATIVE
Nitrite: NEGATIVE
Protein, ur: NEGATIVE mg/dL
Specific Gravity, Urine: 1.008 (ref 1.005–1.030)
pH: 6 (ref 5.0–8.0)

## 2020-02-22 LAB — CBC
HCT: 39.4 % (ref 36.0–46.0)
Hemoglobin: 13.2 g/dL (ref 12.0–15.0)
MCH: 31.3 pg (ref 26.0–34.0)
MCHC: 33.5 g/dL (ref 30.0–36.0)
MCV: 93.4 fL (ref 80.0–100.0)
Platelets: 247 10*3/uL (ref 150–400)
RBC: 4.22 MIL/uL (ref 3.87–5.11)
RDW: 11.7 % (ref 11.5–15.5)
WBC: 10.3 10*3/uL (ref 4.0–10.5)
nRBC: 0 % (ref 0.0–0.2)

## 2020-02-22 LAB — POCT PREGNANCY, URINE: Preg Test, Ur: NEGATIVE

## 2020-02-22 NOTE — ED Triage Notes (Signed)
Pt states she was at work and states "my legs gave out". Pt states hx of the same when she gets hot, no recent illness.

## 2020-02-23 ENCOUNTER — Encounter: Payer: Self-pay | Admitting: Emergency Medicine

## 2020-02-23 ENCOUNTER — Emergency Department
Admission: EM | Admit: 2020-02-23 | Discharge: 2020-02-23 | Disposition: A | Discharge status: Home / Self Care | Payer: Self-pay | Attending: Emergency Medicine | Admitting: Emergency Medicine

## 2020-02-23 DIAGNOSIS — R55 Syncope and collapse: Secondary | ICD-10-CM

## 2020-02-23 DIAGNOSIS — W19XXXA Unspecified fall, initial encounter: Secondary | ICD-10-CM

## 2020-02-23 DIAGNOSIS — M7918 Myalgia, other site: Secondary | ICD-10-CM

## 2020-02-23 LAB — COMPREHENSIVE METABOLIC PANEL
ALT: 33 U/L (ref 0–44)
AST: 24 U/L (ref 15–41)
Albumin: 4.3 g/dL (ref 3.5–5.0)
Alkaline Phosphatase: 65 U/L (ref 38–126)
Anion gap: 9 (ref 5–15)
BUN: 14 mg/dL (ref 6–20)
CO2: 24 mmol/L (ref 22–32)
Calcium: 9.2 mg/dL (ref 8.9–10.3)
Chloride: 104 mmol/L (ref 98–111)
Creatinine, Ser: 0.71 mg/dL (ref 0.44–1.00)
GFR calc Af Amer: >60 mL/min (ref >60)
GFR calc non Af Amer: >60 mL/min (ref >60)
Glucose, Bld: 91 mg/dL (ref 70–99)
Potassium: 3.4 mmol/L — ABNORMAL LOW (ref 3.5–5.1)
Sodium: 137 mmol/L (ref 135–145)
Total Bilirubin: 0.5 mg/dL (ref 0.3–1.2)
Total Protein: 7.3 g/dL (ref 6.5–8.1)

## 2020-02-23 NOTE — Discharge Instructions (Addendum)
You have been seen today in the Emergency Department (ED)  for almost passing out.  Your workup including labs are reassuring.  Your symptoms may be due to mild dehydration (what we call "volume depletion"), so it is important that you drink plenty of non-alcoholic fluids.  Please call your regular doctor as soon as possible to schedule the next available clinic appointment to follow up with him/her regarding your visit to the ED and your symptoms.  Return to the Emergency Department (ED)  if you have any further syncopal episodes (pass out again) or develop ANY chest pain, pressure, tightness, trouble breathing, sudden sweating, or other symptoms that concern you.

## 2020-02-23 NOTE — ED Provider Notes (Signed)
[name] Provider Note  ____________________________________________   First MD Initiated Contact with Patient 02/23/20 [phone]     (approximate)  I have reviewed the triage vital signs and the nursing notes.   HISTORY  Chief Complaint Weakness    HPI Caitlin Guzman is a 24 y.o. female  who reports no specific chronic medical history but he says she has a lot of chronic issues with her stomach and intestines.  She presents by EMS for evaluation after getting weak in the legs and nearly passing out and having a controlled fall.  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  She was able to lower self to the ground but did fall and has a little bit of pain in her right hip.  She has been ambulatory and she did not strike her head or lose consciousness.  She has no headache or neck pain.  She has been able to drink fluids and she denies nausea, vomiting, any acute or unusual abdominal pain, fever, sore throat, shortness of breath, and cough.  The episode was severe and acute in onset and nothing in particular made it better or worse.  She has had a similar episode in the past when she got hot.  She said that she has been working both jobs and sleeps about 6 hours a night.         History reviewed. No pertinent past medical history.  There are no problems to display for this patient.   Past Surgical History:  Procedure Laterality Date  . CHOLECYSTECTOMY  01/08/2014    Prior to Admission medications   Medication Sig Start Date End Date Taking? Authorizing Provider  polyethylene glycol (MIRALAX) packet Take 17 g by mouth daily. 01/08/17   Lavonia Drafts, MD    Allergies Morphine and related  History reviewed. No pertinent family history.  Social  History Social History   Tobacco Use  . Smoking status: Heavy Tobacco Smoker    Packs/day: 0.50  . Smokeless tobacco: Never Used  Substance Use Topics  . Alcohol use: Yes    Alcohol/week: 2.0 standard drinks    Types: 2 Cans of beer per week  . Drug use: Not on file    Review of Systems Constitutional: No fever/chills Eyes: No visual changes. ENT: No sore throat. Cardiovascular: Near syncope and collapse.  Denies chest pain. Respiratory: Denies shortness of breath. Gastrointestinal: No abdominal pain.  No nausea, no vomiting.  No diarrhea.  No constipation. Genitourinary: Negative for dysuria. Musculoskeletal: Negative for neck pain.  Negative for back pain. Integumentary: Negative for rash. Neurological: Generalized weakness.  Negative for headaches, focal weakness or numbness.   ____________________________________________   PHYSICAL EXAM:  VITAL SIGNS: ED Triage Vitals [02/22/20 2330]  Enc Vitals Group     BP 123/83     Pulse Rate 86     Resp 20     Temp 98.7 F (37.1 C)     Temp Source Oral     SpO2 100 %     Weight 63.5 kg (140 lb)     Height 1.626 m (5\' 4" )     Head Circumference      Peak Flow      Pain Score 0     Pain Loc  Pain Edu?      Excl. in GC?     Constitutional: Alert and oriented.  No acute distress at this time. Eyes: Conjunctivae are normal.  Head: Atraumatic. Nose: No congestion/rhinnorhea. Mouth/Throat: Patient is wearing a mask. Neck: No stridor.  No meningeal signs.   Cardiovascular: Normal rate, regular rhythm. Good peripheral circulation. Grossly normal heart sounds. Respiratory: Normal respiratory effort.  No retractions. Gastrointestinal: Soft and nondistended.  She says she has diffuse tenderness to palpation throughout the abdomen but this is normal for her. Musculoskeletal: No lower extremity tenderness nor edema. No gross deformities of extremities. Neurologic:  Normal speech and language. No gross focal neurologic  deficits are appreciated.  Skin:  Skin is warm, dry and intact. Psychiatric: Mood and affect are normal. Speech and behavior are normal.  ____________________________________________   LABS (all labs ordered are listed, but only abnormal results are displayed)  Labs Reviewed  COMPREHENSIVE METABOLIC PANEL - Abnormal; Notable for the following components:      Result Value   Potassium 3.4 (*)    All other components within normal limits  URINALYSIS, COMPLETE (UACMP) WITH MICROSCOPIC - Abnormal; Notable for the following components:   Color, Urine YELLOW (*)    APPearance HAZY (*)    Bacteria, UA RARE (*)    All other components within normal limits  CBC  POC URINE PREG, ED  POCT PREGNANCY, URINE   ____________________________________________  EKG  No indication for emergent EKG ____________________________________________  RADIOLOGY I, Loleta Rose, personally viewed and evaluated these images (plain radiographs) as part of my medical decision making, as well as reviewing the written report by the radiologist.  ED MD interpretation: No indication for emergent imaging  Official radiology report(s): No results found.  ____________________________________________   PROCEDURES   Procedure(s) performed (including Critical Care):  Procedures   ____________________________________________   INITIAL IMPRESSION / MDM / ASSESSMENT AND PLAN / ED COURSE  As part of my medical decision making, I reviewed the following data within the electronic MEDICAL RECORD NUMBER Nursing notes reviewed and incorporated, Labs reviewed , Old chart reviewed, Notes from prior ED visits and [place] Controlled Substance Database   Differential diagnosis includes, but is not limited to, exhaustion, volume depletion/dehydration, electrolyte or metabolic abnormality, less likely acute cardiac arrhythmia or other disturbance.  Patient has been stable in the emergency department for almost 7 hours.  Vital  signs are within normal limits.  Lab work is all essentially normal other than a very mild hyperkalemia.  She is tolerating oral intake without difficulty.  No infectious signs or symptoms.  I suspect the patient is mildly volume depleted and working too hard and not getting enough rest.  We talked about these issues and I encouraged her to rest up and drink plenty of fluids.  I gave her several follow-up recommendations and return precautions.  She understands and agrees with the plan.   ____________________________________________  FINAL CLINICAL IMPRESSION(S) / ED DIAGNOSES  Final diagnoses:  Near syncope  Fall, initial encounter  Musculoskeletal pain     MEDICATIONS GIVEN DURING THIS VISIT:  Medications - No data to display   ED Discharge Orders    None      *Please note:  Caitlin Guzman was evaluated in Emergency Department on 02/23/2020 for the symptoms described in the history of present illness. She was evaluated in the context of the global COVID-19 pandemic, which necessitated consideration that the patient might be at risk for infection with the SARS-CoV-2 virus that causes  COVID-19. Institutional protocols and algorithms that pertain to the evaluation of patients at risk for COVID-19 are in a state of rapid change based on information released by regulatory bodies including the CDC and federal and state organizations. These policies and algorithms were followed during the patient's care in the ED.  Some ED evaluations and interventions may be delayed as a result of limited staffing during the pandemic.*  Note:  This document was prepared using Dragon voice recognition software and may include unintentional dictation errors.   Loleta Rose, MD 02/23/20 [phone]

## 2022-01-14 ENCOUNTER — Encounter: Payer: Self-pay | Admitting: Emergency Medicine

## 2022-01-14 ENCOUNTER — Emergency Department: Payer: Self-pay

## 2022-01-14 ENCOUNTER — Emergency Department
Admission: EM | Admit: 2022-01-14 | Discharge: 2022-01-14 | Disposition: A | Discharge status: Home / Self Care | Payer: Self-pay | Attending: Emergency Medicine | Admitting: Emergency Medicine

## 2022-01-14 DIAGNOSIS — Z9049 Acquired absence of other specified parts of digestive tract: Secondary | ICD-10-CM | POA: Insufficient documentation

## 2022-01-14 DIAGNOSIS — R1030 Lower abdominal pain, unspecified: Secondary | ICD-10-CM | POA: Insufficient documentation

## 2022-01-14 LAB — POC URINE PREG, ED: Preg Test, Ur: NEGATIVE

## 2022-01-14 LAB — COMPREHENSIVE METABOLIC PANEL
ALT: 28 U/L (ref 0–44)
AST: 25 U/L (ref 15–41)
Albumin: 4.2 g/dL (ref 3.5–5.0)
Alkaline Phosphatase: 69 U/L (ref 38–126)
Anion gap: 7 (ref 5–15)
BUN: 14 mg/dL (ref 6–20)
CO2: 23 mmol/L (ref 22–32)
Calcium: 8.8 mg/dL — ABNORMAL LOW (ref 8.9–10.3)
Chloride: 109 mmol/L (ref 98–111)
Creatinine, Ser: 0.56 mg/dL (ref 0.44–1.00)
GFR, Estimated: >60 mL/min (ref >60)
Glucose, Bld: 107 mg/dL — ABNORMAL HIGH (ref 70–99)
Potassium: 3.4 mmol/L — ABNORMAL LOW (ref 3.5–5.1)
Sodium: 139 mmol/L (ref 135–145)
Total Bilirubin: 0.4 mg/dL (ref 0.3–1.2)
Total Protein: 7.3 g/dL (ref 6.5–8.1)

## 2022-01-14 LAB — LIPASE, BLOOD: Lipase: 37 U/L (ref 11–51)

## 2022-01-14 LAB — CBC
HCT: 37.6 % (ref 36.0–46.0)
Hemoglobin: 12.9 g/dL (ref 12.0–15.0)
MCH: 31.1 pg (ref 26.0–34.0)
MCHC: 34.3 g/dL (ref 30.0–36.0)
MCV: 90.6 fL (ref 80.0–100.0)
Platelets: 239 10*3/uL (ref 150–400)
RBC: 4.15 MIL/uL (ref 3.87–5.11)
RDW: 11.9 % (ref 11.5–15.5)
WBC: 6.7 10*3/uL (ref 4.0–10.5)
nRBC: 0 % (ref 0.0–0.2)

## 2022-01-14 LAB — URINALYSIS, ROUTINE W REFLEX MICROSCOPIC
Bilirubin Urine: NEGATIVE
Glucose, UA: NEGATIVE mg/dL
Hgb urine dipstick: NEGATIVE
Ketones, ur: NEGATIVE mg/dL
Leukocytes,Ua: NEGATIVE
Nitrite: NEGATIVE
Protein, ur: NEGATIVE mg/dL
Specific Gravity, Urine: 1.017 (ref 1.005–1.030)
pH: 7 (ref 5.0–8.0)

## 2022-01-14 LAB — TROPONIN I (HIGH SENSITIVITY): Troponin I (High Sensitivity): <2 ng/L (ref <18)

## 2022-01-14 MED ORDER — DROPERIDOL 2.5 MG/ML IJ SOLN
2.5000 mg | Freq: Once | INTRAMUSCULAR | Status: AC
  Administered 2022-01-14: 2.5 mg via INTRAMUSCULAR
  Filled 2022-01-14: qty 2

## 2022-01-14 NOTE — ED Triage Notes (Signed)
Pt arrived via [facility] from work place where pt called out with c/o "all over" abdominal pain x20 mins since lifting knee wrong way while getting ready. Pt denies N/V/D.  ?

## 2022-01-14 NOTE — ED Provider Notes (Signed)
? ?[facility] ?Provider Note ? ? ? Event Date/Time  ? First MD Initiated Contact with Patient 01/14/22 [phone]   ?  (approximate) ? ? ?History  ? ?Abdominal Pain ? ? ?HPI ? ?Caitlin Guzman is a 26 y.o. female who presents to the ED for evaluation of Abdominal Pain ?  ?S/p cholecystectomy. ? ?Patient presents to the ED for evaluation of acute on chronic lower abdominal pain.  She reports 8 years of constant lower abdominal pain that she describes as feeling like a knife being dug into her abdomen constantly.  She attributes this to her intestines only working at 15%, due to her cholecystectomy. ? ?She reports acute worsening of this chronic pain just in the past 3 to 4 hours.  Denies emesis, diarrhea, dysuria or any other associated symptoms.  Denies fever. ? ? ?Physical Exam  ? ?Triage Vital Signs: ?ED Triage Vitals [01/14/22 0310]  ?Enc Vitals Group  ?   BP 134/79  ?   Pulse Rate 100  ?   Resp 18  ?   Temp 99 ?F (37.2 ?C)  ?   Temp Source Oral  ?   SpO2 100 %  ?   Weight   ?   Height   ?   Head Circumference   ?   Peak Flow   ?   Pain Score   ?   Pain Loc   ?   Pain Edu?   ?   Excl. in GC?   ? ? ?Most recent vital signs: ?Vitals:  ? 01/14/22 0310 01/14/22 0624  ?BP: 134/79 132/78  ?Pulse: 100 89  ?Resp: 18 16  ?Temp: 99 ?F (37.2 ?C)   ?SpO2: 100% 100%  ? ? ?General: Awake, no distress.  Flat affect. ?CV:  Good peripheral perfusion.  ?Resp:  Normal effort.  ?Abd:  No distention.  Voluntary guarding make examination difficult.  Lower abdominal tenderness without peritoneal features appreciated. ?MSK:  No deformity noted.  ?Neuro:  No focal deficits appreciated. ?Other:   ? ? ?ED Results / Procedures / Treatments  ? ?Labs ?(all labs ordered are listed, but only abnormal results are displayed) ?Labs Reviewed  ?COMPREHENSIVE METABOLIC PANEL - Abnormal; Notable for the following components:  ?    Result Value  ? Potassium 3.4 (*)   ? Glucose, Bld 107 (*)   ? Calcium 8.8 (*)   ? All other  components within normal limits  ?URINALYSIS, ROUTINE W REFLEX MICROSCOPIC - Abnormal; Notable for the following components:  ? Color, Urine YELLOW (*)   ? APPearance HAZY (*)   ? All other components within normal limits  ?LIPASE, BLOOD  ?CBC  ?POC URINE PREG, ED  ?TROPONIN I (HIGH SENSITIVITY)  ? ? ?EKG ?Sinus tachycardia with a rate of 102 bpm.  Normal axis and intervals.  No evidence of acute ischemia.  No comparison. ? ?RADIOLOGY ?CT abdomen/pelvis reviewed by me with moderate stool burden, but no SBO. ? ?Official radiology report(s): ?CT ABDOMEN PELVIS WO CONTRAST ? ?Result Date: 01/14/2022 ?CLINICAL DATA:  Right lower quadrant pain. EXAM: CT ABDOMEN AND PELVIS WITHOUT CONTRAST TECHNIQUE: Multidetector CT imaging of the abdomen and pelvis was performed following the standard protocol without IV contrast. RADIATION DOSE REDUCTION: This exam was performed according to the departmental dose-optimization program which includes automated exposure control, adjustment of the mA and/or kV according to patient size and/or use of iterative reconstruction technique. COMPARISON:  01/08/2017 FINDINGS: Lower chest: No acute abnormality. Hepatobiliary: No focal  liver abnormality is seen. Prior cholecystectomy. Pancreas: Unremarkable. No pancreatic ductal dilatation or surrounding inflammatory changes. Spleen: Normal in size without focal abnormality. Adrenals/Urinary Tract: Adrenal glands are unremarkable. Kidneys are normal, without renal calculi, focal lesion, or hydronephrosis. Bladder is unremarkable. Stomach/Bowel: Stomach is within normal limits. No evidence of bowel wall thickening, distention, or inflammatory changes. Appendix is normal. Moderate amount of stool throughout the colon. Vascular/Lymphatic: No significant vascular findings are present. No enlarged abdominal or pelvic lymph nodes. Reproductive: Uterus and bilateral adnexa are unremarkable. Intrauterine device in satisfactory position. Other: No abdominal  wall hernia or abnormality. No abdominopelvic ascites. Musculoskeletal: No acute osseous abnormality. No aggressive osseous lesion. IMPRESSION: 1. No acute abdominal or pelvic pathology. Electronically Signed   By: Elige Ko M.D.   On: 01/14/2022 07:00   ? ?PROCEDURES and INTERVENTIONS: ? ?Procedures ? ?Medications  ?droperidol (INAPSINE) 2.5 MG/ML injection 2.5 mg (2.5 mg Intramuscular Given 01/14/22 0447)  ? ? ? ?IMPRESSION / MDM / ASSESSMENT AND PLAN / ED COURSE  ?I reviewed the triage vital signs and the nursing notes. ? ?26 year old woman presents to the ED with acute on chronic abdominal pain without evidence of acute pathology and suitable for outpatient management.  She looks clinically well overall.  Is initially guarding quite a bit making abdominal examination difficult, but this improves and resolves after droperidol and tolerates exam much more.  No significant peritoneal features.  Diffuse lower and mild tenderness.  Work-up is benign with no leukocytosis .  Essentially normal CMP.  Urine without infectious features.  No evidence of pancreatitis or ACS.  Symptoms resolved after droperidol and her CT scan is normal.  No barriers to outpatient management. ? ?Clinical Course as of 01/14/22 0715  ?Wed Jan 14, 2022  ?0659 Reassessed.  Patient reports feeling much better now.  Resting comfortably.  We discussed pending CT.  Signed out to oncoming provider to follow-up on this study. [DS]  ?  ?Clinical Course User Index ?[DS] Delton Prairie, MD  ? ? ? ?FINAL CLINICAL IMPRESSION(S) / ED DIAGNOSES  ? ?Final diagnoses:  ?Lower abdominal pain  ? ? ? ?Rx / DC Orders  ? ?ED Discharge Orders   ? ? None  ? ?  ? ? ? ?Note:  This document was prepared using Dragon voice recognition software and may include unintentional dictation errors. ?  ?Delton Prairie, MD ?01/14/22 [id number] ? ?

## 2022-01-14 NOTE — Discharge Instructions (Signed)
Please take Tylenol and ibuprofen/Advil for your pain.  It is safe to take them together, or to alternate them every few hours.  Take up to 1000mg of Tylenol at a time, up to 4 times per day.  Do not take more than 4000 mg of Tylenol in 24 hours.  For ibuprofen, take 400-600 mg, 4-5 times per day. ° ° °

## 2022-12-05 IMAGING — CT CT ABD-PELV W/O CM
2 of 5 series · 16 of 46 positions shown, 18 images · non-contrast
Comparison: 01/08/2017

CLINICAL DATA: Right lower quadrant pain.



[Series 3: thins · axial · 0.68mm/px · z∈[-976,-580]mm · 13 of 615 slices shown, 15 images]
[im 25/615  soft-tissue]
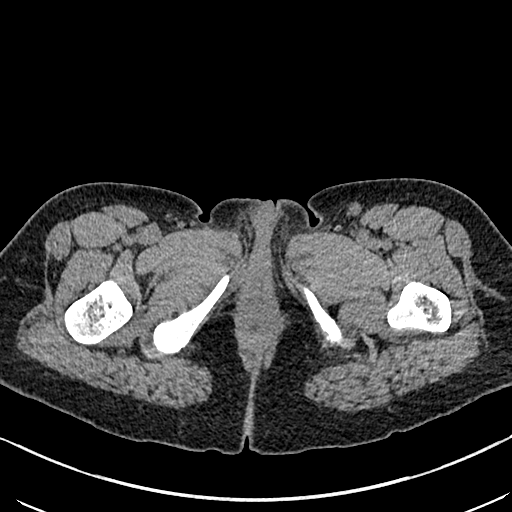
[im 25/615  bone]
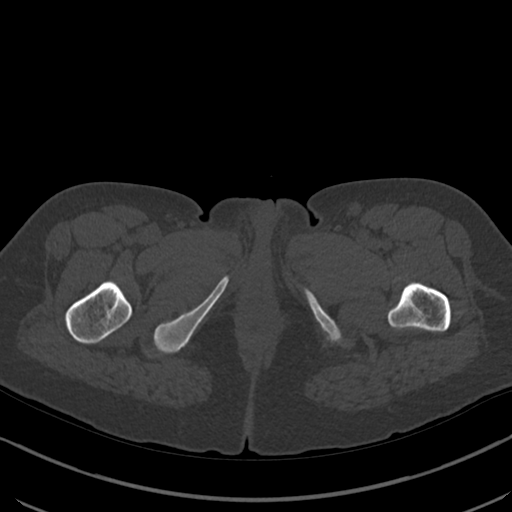
[im 74/615  soft-tissue]
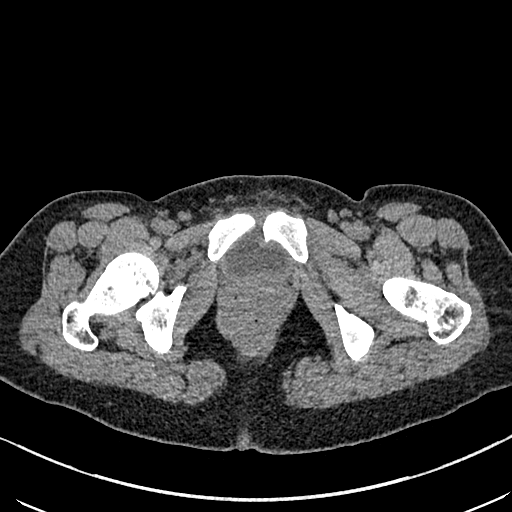
[im 123/615  soft-tissue]
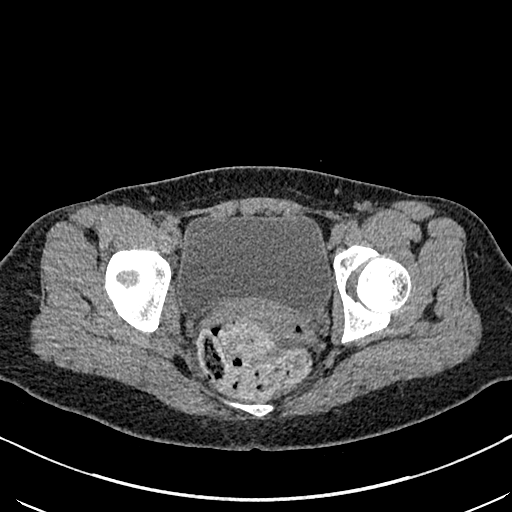
[im 172/615  soft-tissue]
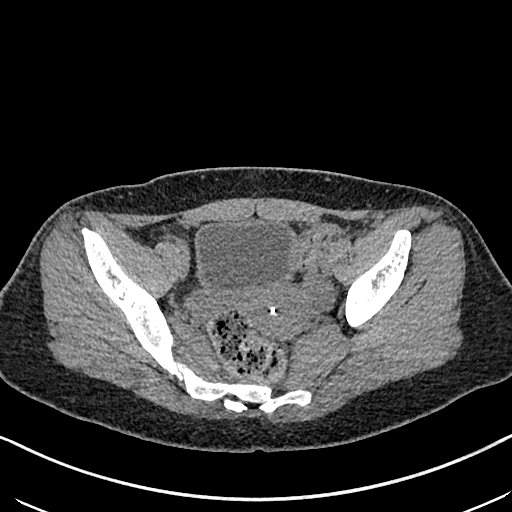
[im 222/615  soft-tissue]
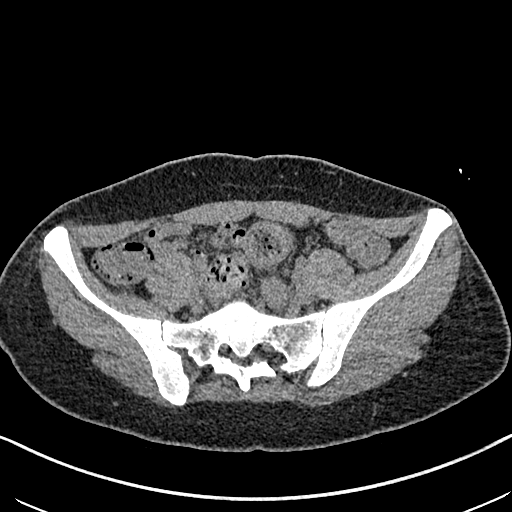
[im 271/615  soft-tissue]
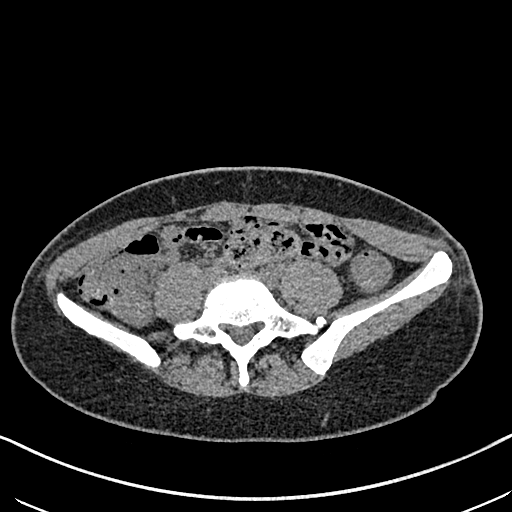
[im 320/615  soft-tissue]
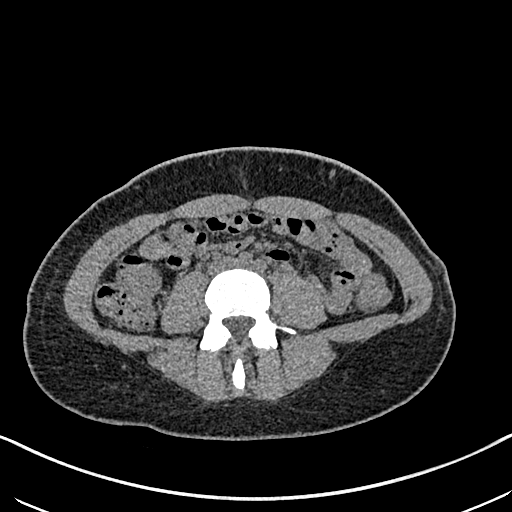
[im 344/615  soft-tissue]
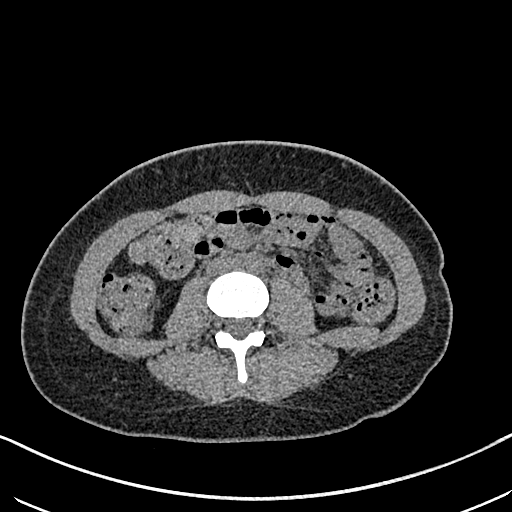
[im 393/615  soft-tissue]
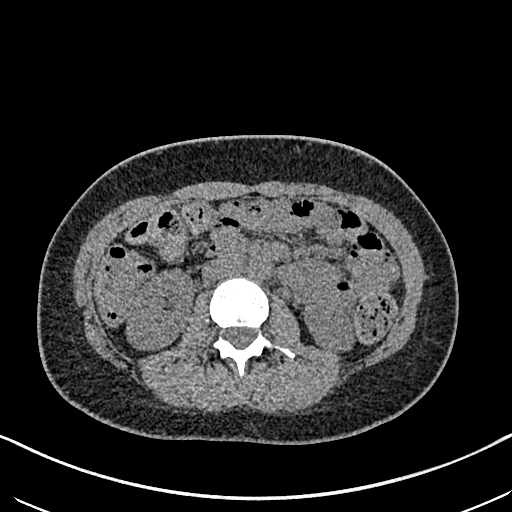
[im 393/615  bone]
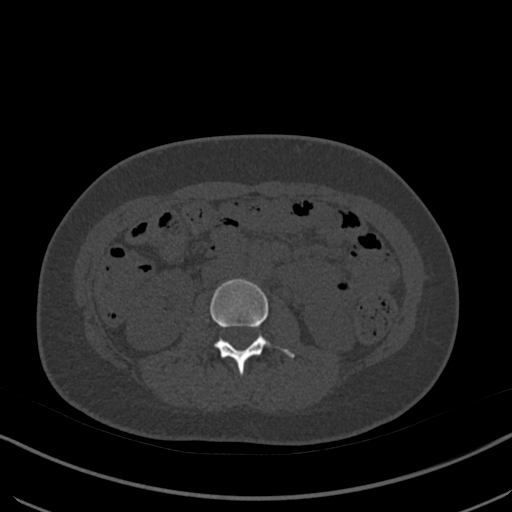
[im 443/615  soft-tissue]
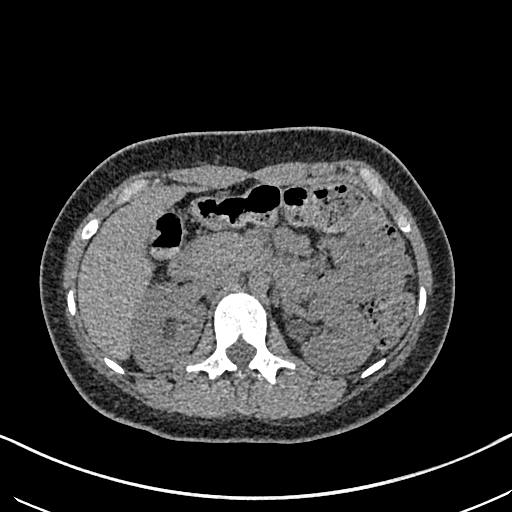
[im 492/615  soft-tissue]
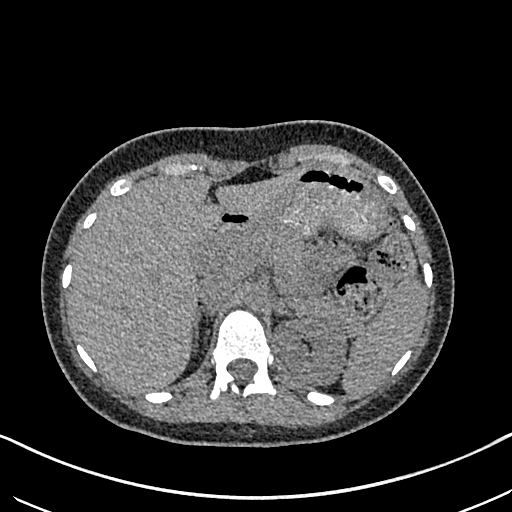
[im 541/615  soft-tissue]
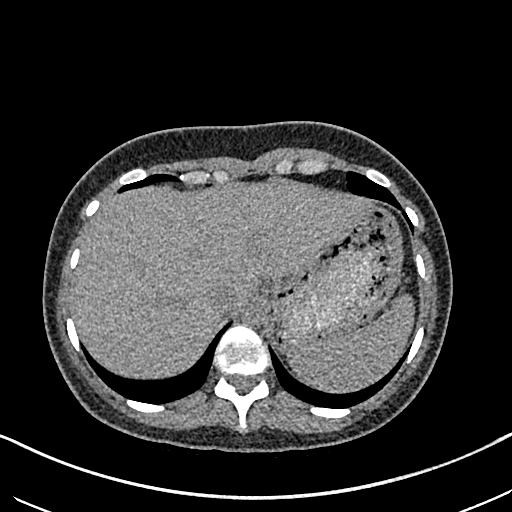
[im 590/615  soft-tissue]
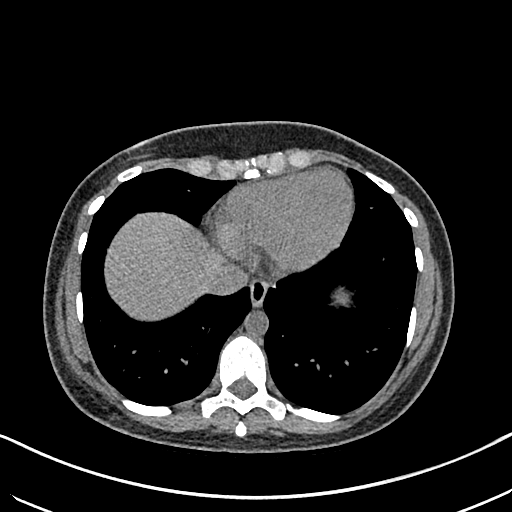

[Series 5: cor · coronal · 0.73mm/px · 3 of 75 slices shown]
[im 25/75  soft-tissue]
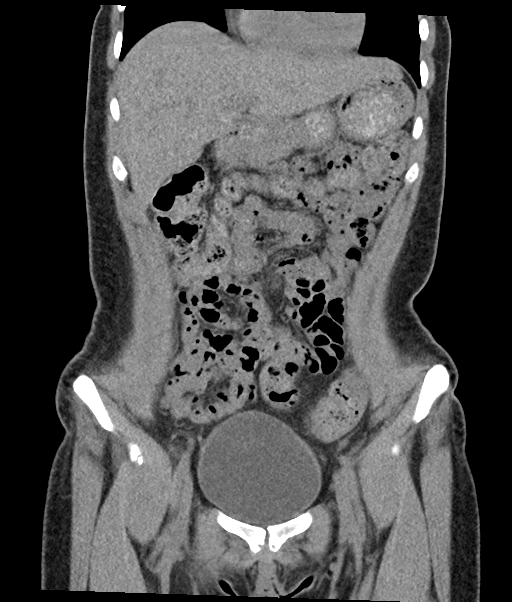
[im 33/75  soft-tissue]
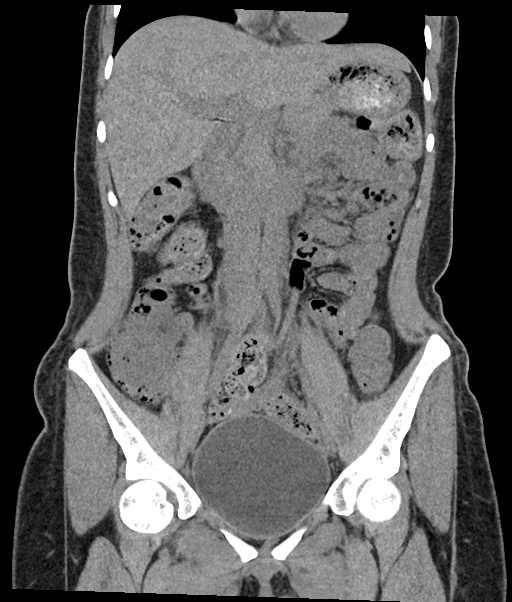
[im 42/75  soft-tissue]
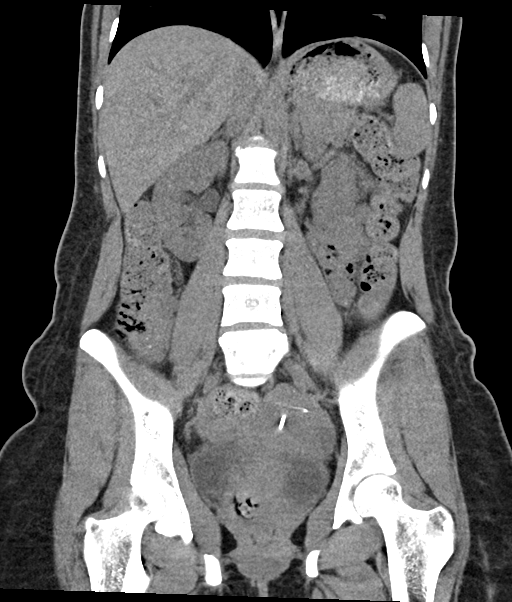

[16 of 46 positions shown; findings below may reference images not displayed]

FINDINGS: Lower chest: No acute abnormality.

Hepatobiliary: No focal liver abnormality is seen. Prior
cholecystectomy.

Pancreas: Unremarkable. No pancreatic ductal dilatation or
surrounding inflammatory changes.

Spleen: Normal in size without focal abnormality.

Adrenals/Urinary Tract: Adrenal glands are unremarkable. Kidneys are
normal, without renal calculi, focal lesion, or hydronephrosis.
Bladder is unremarkable.

Stomach/Bowel: Stomach is within normal limits. No evidence of bowel
wall thickening, distention, or inflammatory changes. Appendix is
normal. Moderate amount of stool throughout the colon.

Vascular/Lymphatic: No significant vascular findings are present. No
enlarged abdominal or pelvic lymph nodes.

Reproductive: Uterus and bilateral adnexa are unremarkable.
Intrauterine device in satisfactory position.

Other: No abdominal wall hernia or abnormality. No abdominopelvic
ascites.

Musculoskeletal: No acute osseous abnormality. No aggressive osseous
lesion.
IMPRESSION: 1. No acute abdominal or pelvic pathology.
